# Patient Record
Sex: Female | Born: 1970 | Race: Black or African American | Hispanic: No | Marital: Married | Smoking: Never smoker
Health system: Southern US, Community
[De-identification: ages and names within clinical notes are randomized; demographics above are authoritative.]

## PROBLEM LIST (undated history)

## (undated) DIAGNOSIS — D649 Anemia, unspecified: Secondary | ICD-10-CM

## (undated) DIAGNOSIS — D259 Leiomyoma of uterus, unspecified: Secondary | ICD-10-CM

## (undated) DIAGNOSIS — I10 Essential (primary) hypertension: Secondary | ICD-10-CM

## (undated) HISTORY — PX: UTERINE FIBROID SURGERY: SHX826

## (undated) HISTORY — DX: Leiomyoma of uterus, unspecified: D25.9

## (undated) HISTORY — DX: Anemia, unspecified: D64.9

## (undated) HISTORY — PX: KNEE SURGERY: SHX244

## (undated) HISTORY — DX: Essential (primary) hypertension: I10

---

## 2009-01-07 ENCOUNTER — Encounter: Payer: Self-pay | Admitting: Cardiology

## 2009-07-31 ENCOUNTER — Encounter: Payer: Self-pay | Admitting: Cardiology

## 2009-08-02 ENCOUNTER — Ambulatory Visit: Payer: Self-pay | Admitting: Cardiology

## 2009-08-02 DIAGNOSIS — R002 Palpitations: Secondary | ICD-10-CM

## 2009-08-02 DIAGNOSIS — R079 Chest pain, unspecified: Secondary | ICD-10-CM

## 2009-08-02 DIAGNOSIS — I10 Essential (primary) hypertension: Secondary | ICD-10-CM | POA: Insufficient documentation

## 2009-08-02 DIAGNOSIS — E669 Obesity, unspecified: Secondary | ICD-10-CM

## 2009-08-23 ENCOUNTER — Encounter: Admission: RE | Admit: 2009-08-23 | Discharge: 2009-08-23 | Payer: Self-pay | Admitting: Family Medicine

## 2009-08-27 ENCOUNTER — Ambulatory Visit (HOSPITAL_COMMUNITY): Admission: RE | Admit: 2009-08-27 | Discharge: 2009-08-27 | Payer: Self-pay | Admitting: Cardiology

## 2009-08-27 ENCOUNTER — Ambulatory Visit: Payer: Self-pay

## 2009-08-27 ENCOUNTER — Encounter: Payer: Self-pay | Admitting: Cardiology

## 2009-08-27 ENCOUNTER — Ambulatory Visit: Payer: Self-pay | Admitting: Cardiology

## 2010-07-01 NOTE — Assessment & Plan Note (Signed)
Summary: NP6/ OBSEITY, HTN , CHESTPRESSURE. PT HAS [facility]/ GD   CC:  referal from Dr Patsy Lager.  History of Present Illness: 40 year old female for evaluation of chest pain. No prior cardiac history. The patient states that she has had a pain in the left upper chest when she exerts herself relieved with rest for  years. This goes back to high school days. However she does not have dyspnea on exertion, orthopnea, PND, pedal edema, palpitations, syncope or exertional chest pain. She also recently had a weeklong episode of tingling and numbness in her left upper extremity. There is also a pain in her left chest. This was continuous without ever completely resolving. There is no change with using her left upper extremity. It was nonexertional, pleuritic or related to food. It did not radiate. There were no associated symptoms. Because of the above we are asked to further evaluate.  Preventive Screening-Counseling & Management  Alcohol-Tobacco     Smoking Status: never      Drug Use:  no.    Current Medications (verified): 1)  Multivitamins   Tabs (Multiple Vitamin) .Marland Kitchen.. 1 Taqb By Mouth Once Daily When Pt Rembers  Past History:  Past Medical History: HYPERTENSION (ICD-401.9)  Past Surgical History: Arthroscopic surgery on right knee  Family History: Reviewed history and no changes required. No premature CAD Hypertension  Social History: Reviewed history and no changes required. Full Time Married  Tobacco Use - No.  Alcohol Use - yes Drug Use - no Smoking Status:  never Drug Use:  no  Review of Systems       no fevers or chills, productive cough, hemoptysis, dysphasia, odynophagia, melena, hematochezia, dysuria, hematuria, rash, seizure activity, orthopnea, PND, pedal edema, claudication. Remaining systems are negative. Menstrual cycles are normal.   Vital Signs:  Patient profile:   40 year old female Height:      64 inches Weight:      179 pounds BMI:     30.84 Pulse rate:    86 / minute Resp:     12 per minute BP sitting:   136 / 94  (left arm)  Vitals Entered By: Kem Parkinson (August 02, 2009 11:40 AM)  Physical Exam  General:  Blood pressure difficult to auscultate in the left upper extremity but systolic appears to be 110-114. Right upper extremity systolic 140. Well developed/well nourished in NAD Skin warm/dry Patient not depressed No peripheral clubbing Back-normal HEENT-normal/normal eyelids Neck supple/normal carotid upstroke bilaterally; no bruits; no JVD; no thyromegaly chest - CTA/ normal expansion CV - RRR/normal S1 and S2; no murmurs, rubs or gallops;  PMI nondisplaced Abdomen -NT/ND, no HSM, no mass, + bowel sounds, no bruit 2+ femoral pulses, no bruits Ext-no edema, chords, 2+ DP Neuro-grossly nonfocal     EKG  Procedure date:  08/02/2009  Findings:      Sinus rhythm at a rate of 86. Axis normal. No ST changes noted. RV conduction delay.  Impression & Recommendations:  Problem # 1:  CHEST PAIN (ICD-786.50) Symptoms very atypical. However she is concerned about these. We'll schedule stress echocardiogram for risk stratification. Orders: Stress Echo (Stress Echo)  Problem # 2:  HYPERTENSION (ICD-401.9) Blood pressure difficult to auscultate in left upper extremity but appears to be significantly different compared to right upper extremity. I will check arterial Dopplers to exclude subclavian stenosis. If negative then she can followup with her primary care physician. If her blood pressure continues to be elevated and certain medications can be added as  needed. Orders: Arterial Duplex Upper Extremity (Arterial Duplex Up )  Patient Instructions: 1)  Your physician recommends that you schedule a follow-up appointment in: ANEEDED PENDING TEST RESULTS 2)  Your physician has requested that you have a upper extremity arterial duplex.  This test is an ultrasound of the arteries in the legs or arms.  It looks at arterial blood flow  in the legs and arms.  Allow one hour for Lower and Upper Arterial scans. There are no restrictions or special instructions. 3)  Your physician has requested that you have a stress echocardiogram. For further information please visit [email or web address]  Please follow instruction sheet as given.

## 2011-01-06 ENCOUNTER — Other Ambulatory Visit: Payer: Self-pay | Admitting: Family Medicine

## 2011-01-06 DIAGNOSIS — Z1231 Encounter for screening mammogram for malignant neoplasm of breast: Secondary | ICD-10-CM

## 2011-01-14 ENCOUNTER — Ambulatory Visit: Payer: Self-pay

## 2011-01-14 ENCOUNTER — Ambulatory Visit
Admission: RE | Admit: 2011-01-14 | Discharge: 2011-01-14 | Disposition: A | Discharge status: Home / Self Care | Payer: 59 | Source: Ambulatory Visit | Attending: Family Medicine | Admitting: Family Medicine

## 2011-01-14 DIAGNOSIS — Z1231 Encounter for screening mammogram for malignant neoplasm of breast: Secondary | ICD-10-CM

## 2011-01-16 ENCOUNTER — Other Ambulatory Visit: Payer: Self-pay | Admitting: Family Medicine

## 2011-01-16 ENCOUNTER — Other Ambulatory Visit: Payer: Self-pay | Admitting: Internal Medicine

## 2011-01-16 DIAGNOSIS — R102 Pelvic and perineal pain: Secondary | ICD-10-CM

## 2011-01-16 DIAGNOSIS — R928 Other abnormal and inconclusive findings on diagnostic imaging of breast: Secondary | ICD-10-CM

## 2011-01-19 ENCOUNTER — Ambulatory Visit
Admission: RE | Admit: 2011-01-19 | Discharge: 2011-01-19 | Disposition: A | Discharge status: Home / Self Care | Payer: 59 | Source: Ambulatory Visit | Attending: Internal Medicine | Admitting: Internal Medicine

## 2011-01-19 DIAGNOSIS — R102 Pelvic and perineal pain: Secondary | ICD-10-CM

## 2011-01-23 ENCOUNTER — Ambulatory Visit
Admission: RE | Admit: 2011-01-23 | Discharge: 2011-01-23 | Disposition: A | Discharge status: Home / Self Care | Payer: 59 | Source: Ambulatory Visit | Attending: Family Medicine | Admitting: Family Medicine

## 2011-01-23 DIAGNOSIS — R928 Other abnormal and inconclusive findings on diagnostic imaging of breast: Secondary | ICD-10-CM

## 2011-08-11 ENCOUNTER — Other Ambulatory Visit: Payer: Self-pay | Admitting: Internal Medicine

## 2011-08-11 DIAGNOSIS — D249 Benign neoplasm of unspecified breast: Secondary | ICD-10-CM

## 2011-08-19 ENCOUNTER — Ambulatory Visit
Admission: RE | Admit: 2011-08-19 | Discharge: 2011-08-19 | Disposition: A | Discharge status: Home / Self Care | Payer: Self-pay | Source: Ambulatory Visit | Attending: Internal Medicine | Admitting: Internal Medicine

## 2011-08-19 ENCOUNTER — Ambulatory Visit
Admission: RE | Admit: 2011-08-19 | Discharge: 2011-08-19 | Disposition: A | Discharge status: Home / Self Care | Payer: 59 | Source: Ambulatory Visit | Attending: Internal Medicine | Admitting: Internal Medicine

## 2011-08-19 DIAGNOSIS — D249 Benign neoplasm of unspecified breast: Secondary | ICD-10-CM

## 2012-01-06 ENCOUNTER — Other Ambulatory Visit: Payer: Self-pay | Admitting: Internal Medicine

## 2012-01-06 DIAGNOSIS — D249 Benign neoplasm of unspecified breast: Secondary | ICD-10-CM

## 2012-01-25 ENCOUNTER — Ambulatory Visit
Admission: RE | Admit: 2012-01-25 | Discharge: 2012-01-25 | Disposition: A | Discharge status: Home / Self Care | Payer: 59 | Source: Ambulatory Visit | Attending: Internal Medicine | Admitting: Internal Medicine

## 2012-01-25 DIAGNOSIS — D249 Benign neoplasm of unspecified breast: Secondary | ICD-10-CM

## 2013-04-06 ENCOUNTER — Ambulatory Visit (INDEPENDENT_AMBULATORY_CARE_PROVIDER_SITE_OTHER): Payer: Self-pay

## 2013-04-06 ENCOUNTER — Encounter (INDEPENDENT_AMBULATORY_CARE_PROVIDER_SITE_OTHER): Payer: Self-pay

## 2013-04-06 ENCOUNTER — Encounter: Payer: Self-pay | Admitting: Neurology

## 2013-04-06 ENCOUNTER — Ambulatory Visit (INDEPENDENT_AMBULATORY_CARE_PROVIDER_SITE_OTHER): Payer: Self-pay | Admitting: Neurology

## 2013-04-06 ENCOUNTER — Other Ambulatory Visit: Payer: Self-pay | Admitting: Neurology

## 2013-04-06 VITALS — BP 152/103 | HR 92 | Ht 64.0 in | Wt 174.0 lb

## 2013-04-06 DIAGNOSIS — H538 Other visual disturbances: Secondary | ICD-10-CM

## 2013-04-06 DIAGNOSIS — H4710 Unspecified papilledema: Secondary | ICD-10-CM

## 2013-04-06 MED ORDER — GADOPENTETATE DIMEGLUMINE 469.01 MG/ML IV SOLN: 16.0000 mL | Freq: Once | INTRAVENOUS | Status: AC | PRN

## 2013-04-06 NOTE — Patient Instructions (Signed)
Overall you are doing fairly well but I do want to suggest a few things today:   [facility] and get blood work done. Bring the results back to our office. If they are within normal limits we will be able to go ahead with a MRI.   We will follow up with you once the MRI is completed  Please also call us for any test results so we can go over those with you on the phone.  My clinical assistant and will answer any of your questions and relay your messages to me and also relay most of my messages to you.   Our phone number is [phone]. We also have an after hours call service for urgent matters and there is a physician on-call for urgent questions. For any emergencies you know to call 911 or go to the nearest emergency room

## 2013-04-06 NOTE — Progress Notes (Signed)
[facility]    Provider:  Dr Hosie Poisson Referring Provider: No ref. provider found Primary Care Physician:  Tally Due, MD  CC:  Bilateral papilledema   HPI:  Destiny Keller is a 42 y.o. female here as a referral for bilateral papilledema.   Was seeing her eye doctor due to blurry vision for the past few months. Blurry vision worse upon waking and in the morning, wakes up and it is difficult to focus, gets better as day goes on. Describes as a blurry/foggy feeling. No transient obscurations. No blinking lights/scotomas. No noted difficulty with peripheral vision. Notes some pressure around her eyes but no pain. No headaches noted. No focal numbness or weakness. No dizziness, no vertigo. No recent weight gain or loss.  Review of Systems: Out of a complete 14 system review, the patient complains of only the following symptoms, and all other reviewed systems are negative + for blurred vision and anemia  History   Social History  . Marital Status: Married    Spouse Name: michael    Number of Children: 0  . Years of Education: college   Occupational History  . self employed    Social History Main Topics  . Smoking status: Never Smoker   . Smokeless tobacco: Not on file  . Alcohol Use: No  . Drug Use: No  . Sexual Activity: Yes   Other Topics Concern  . Not on file   Social History Narrative  . No narrative on file    No family history on file.  Past Medical History  Diagnosis Date  . Hypertension   . Fibroid uterus   . Anemia     Past Surgical History  Procedure Laterality Date  . Uterine fibroid surgery    . Knee surgery      Current Outpatient Prescriptions  Medication Sig Dispense Refill  . ergocalciferol (VITAMIN D2) 50000 UNITS capsule Take 50,000 Units by mouth once a week.      . hydrochlorothiazide (MICROZIDE) 12.5 MG capsule Take 12.5 mg by mouth daily.       No current facility-administered medications for this visit.     Allergies as of 04/06/2013 - Review Complete 04/06/2013  Allergen Reaction Noted  . Penicillins  04/06/2013    Vitals: BP 152/103  Pulse 92  Ht 5\' 4"  (1.626 m)  Wt 174 lb (78.926 kg)  BMI 29.85 kg/m2  LMP 03/14/2013 Last Weight:  Wt Readings from Last 1 Encounters:  04/06/13 174 lb (78.926 kg)   Last Height:   Ht Readings from Last 1 Encounters:  04/06/13 5\' 4"  (1.626 m)     Physical exam: Exam: Gen: NAD, conversant Eyes: anicteric sclerae, moist conjunctivae HENT: Atraumatic, oropharynx clear Neck: Trachea midline; supple,  Lungs: CTA, no wheezing, rales, rhonic                          CV: RRR, no MRG Abdomen: Soft, non-tender;  Extremities: No peripheral edema  Skin: Normal temperature, no rash,  Psych: Appropriate affect, pleasant  Neuro: MS: AA&Ox3, appropriately interactive, normal affect   Speech: fluent w/o paraphasic error  Memory: good recent and remote recall  CN: PERRL, visual fields full to finger count bilaterally, papilledema  noted in OD, unable to fully visualize OS EOMI no nystagmus, no ptosis, sensation intact to LT V1-V3 bilat, face symmetric, no weakness, hearing grossly intact, palate elevates symmetrically, shoulder shrug 5/5 bilat,  tongue protrudes midline, no fasiculations noted.  Motor: normal bulk and tone Strength: 5/5  In all extremities  Coord: rapid alternating and point-to-point (FNF, HTS) movements intact.  Reflexes: symmetrical, bilat downgoing toes  Sens: LT intact in all extremities  Gait: posture, stance, stride and arm-swing normal. Tandem gait intact. Able to walk on heels and toes. Romberg absent.   Assessment:  After physical and neurologic examination, review of laboratory studies, imaging, neurophysiology testing and pre-existing records, assessment will be reviewed on the problem list.  Plan:  Treatment plan and additional workup will be reviewed under Problem List.  1)Blurry  vision 2)Papilledema  42 year old woman sent over from eye clinic for noted bilateral papilledema and blurry vision. Patient notes symptoms are worse in the morning upon waking and improve as the day goes on. Symptoms are concerning for possible increased intracranial pressure. Differential would include structural lesion versus IIH. Will check brain MRI with and without contrast. Pending results would consider starting patient on Diamox and or lumbar puncture.

## 2013-04-07 ENCOUNTER — Telehealth: Payer: Self-pay | Admitting: Neurology

## 2013-04-07 MED ORDER — ACETAZOLAMIDE 125 MG PO TABS: ORAL_TABLET | ORAL | Status: DC

## 2013-04-07 MED ORDER — GADOPENTETATE DIMEGLUMINE 469.01 MG/ML IV SOLN: 16.0000 mL | Freq: Once | INTRAVENOUS | Status: AC | PRN

## 2013-04-07 NOTE — Telephone Encounter (Signed)
Called patient to discuss MRI results. 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 At this time due to the fact that she is having visual changes we will start her on Diamox 125 mg 3 times a day. Will plan to have her followup in 3 months, she was instructed to call the office to schedule appointment her convenience. She is counseled potential risks of Diamox. She was counseled extensively that if her vision worsens or she develops of her headaches to medially call our office. We will consider a lumbar puncture in the future pending results of Diamox.

## 2013-04-26 ENCOUNTER — Encounter: Payer: Self-pay | Admitting: Neurology

## 2013-09-04 ENCOUNTER — Telehealth: Payer: Self-pay | Admitting: Neurology

## 2013-09-04 ENCOUNTER — Other Ambulatory Visit: Payer: Self-pay | Admitting: Obstetrics and Gynecology

## 2013-09-04 DIAGNOSIS — D249 Benign neoplasm of unspecified breast: Secondary | ICD-10-CM

## 2013-09-04 NOTE — Telephone Encounter (Signed)
Pt called needs to get in for her 3 mth f/u per Dr. Hazle Quant notes. 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 Pt would like for someone to call her to see if there is a work in apt through [employer]. Pt is in [place] and will be heading back to [place] after Wed. If there is no opening pt would like to see if Dr. Janann Colonel can do a referral in [place] for her. Thanks

## 2013-09-04 NOTE — Telephone Encounter (Signed)
Patient calling requesting appointment, no open slots available, patient now lives and works in [place] and states that if you can not see her either on Tuesday or Wednesday, she request a referral for a neurologist in [place], [place]. Please advise.

## 2013-09-04 NOTE — Telephone Encounter (Signed)
Please schedule her for 3pm on Wednesday. Thanks. (the slot will need to be unblocked)

## 2013-09-06 ENCOUNTER — Ambulatory Visit (INDEPENDENT_AMBULATORY_CARE_PROVIDER_SITE_OTHER): Payer: BC Managed Care – PPO | Admitting: Neurology

## 2013-09-06 ENCOUNTER — Encounter: Payer: Self-pay | Admitting: Neurology

## 2013-09-06 VITALS — BP 143/98 | HR 89 | Ht 64.0 in | Wt 164.0 lb

## 2013-09-06 DIAGNOSIS — R51 Headache: Secondary | ICD-10-CM

## 2013-09-06 NOTE — Progress Notes (Signed)
[facility]    Provider:  Dr Janann Colonel Referring Provider: Orma Flaming, MD Primary Care Physician:  Kennon Portela, MD  CC:  Bilateral papilledema   HPI:  Destiny Keller is a 43 y.o. 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 Last visit was 04/06/13 at which time she had a MRI pertinent for empty sella and was started on diamox. Notes minimal to no headaches, if she misses a dose then she will have a slight headache. Currently taking diamox 3x a day, tolerating it well. Continues to have difficulty with vision, no noted transient obscurations. Has not seen eye doctor in around 6 months.   Initial visit: Was seeing her eye doctor due to blurry vision for the past few months. Blurry vision worse upon waking and in the morning, wakes up and it is difficult to focus, gets better as day goes on. Describes as a blurry/foggy feeling. No transient obscurations. No blinking lights/scotomas. No noted difficulty with peripheral vision. Notes some pressure around her eyes but no pain. No headaches noted. No focal numbness or weakness. No dizziness, no vertigo. No recent weight gain or loss.  Review of Systems: Out of a complete 14 system review, the patient complains of only the following symptoms, and all other reviewed systems are negative + for blurred vision and anemia  History   Social History  . Marital Status: Married    Spouse Name: michael    Number of Children: 0  . Years of Education: college   Occupational History  . self employed    Social History Main Topics  . Smoking status: Never Smoker   . Smokeless tobacco: Never Used  . Alcohol Use: No  . Drug Use: No  . Sexual Activity: Yes   Other Topics Concern  . Not on file   Social History Narrative   Married, no children   Right handed   Bachelor degree   None    No family history on file.  Past Medical History  Diagnosis Date  . Hypertension   . Fibroid  uterus   . Anemia     Past Surgical History  Procedure Laterality Date  . Uterine fibroid surgery    . Knee surgery      Current Outpatient Prescriptions  Medication Sig Dispense Refill  . acetaZOLAMIDE (DIAMOX) 125 MG tablet 1 qd for 3 days, then increase to 1 tab bid for 3 days then 1 tab tid  90 tablet  3  . ergocalciferol (VITAMIN D2) 50000 UNITS capsule Take 50,000 Units by mouth once a week.      . hydrochlorothiazide (MICROZIDE) 12.5 MG capsule Take 12.5 mg by mouth daily.       No current facility-administered medications for this visit.    Allergies as of 09/06/2013 - Review Complete 09/06/2013  Allergen Reaction Noted  . Penicillins  04/06/2013    Vitals: BP 143/98  Pulse 89  Ht 5\' 4"  (1.626 m)  Wt 164 lb (74.39 kg)  BMI 28.14 kg/m2 Last Weight:  Wt Readings from Last 1 Encounters:  09/06/13 164 lb (74.39 kg)   Last Height:   Ht Readings from Last 1 Encounters:  09/06/13 5\' 4"  (1.626 m)     Physical exam: Exam: Gen: NAD, conversant Eyes: anicteric sclerae, moist conjunctivae HENT: Atraumatic, oropharynx clear Neck: Trachea midline; supple,  Lungs: CTA, no wheezing, rales, rhonic  CV: RRR, no MRG Abdomen: Soft, non-tender;  Extremities: No peripheral edema  Skin: Normal temperature, no rash,  Psych: Appropriate affect, pleasant  Neuro: MS: AA&Ox3, appropriately interactive, normal affect   Speech: fluent w/o paraphasic error  Memory: good recent and remote recall  CN: PERRL, visual fields full to finger count bilaterally, unable to visualize optic disc bilaterally due to pupil size, EOMI no nystagmus, no ptosis, sensation intact to LT V1-V3 bilat, face symmetric, no weakness, hearing grossly intact, palate elevates symmetrically, shoulder shrug 5/5 bilat,  tongue protrudes midline, no fasiculations noted.  Motor: normal bulk and tone Strength: 5/5  In all extremities  Coord: rapid alternating and point-to-point (FNF,  HTS) movements intact.  Reflexes: symmetrical, bilat downgoing toes  Sens: LT intact in all extremities  Gait: posture, stance, stride and arm-swing normal. Tandem gait intact. Able to walk on heels and toes. Romberg absent.   Assessment:  After physical and neurologic examination, review of laboratory studies, imaging, neurophysiology testing and pre-existing records, assessment will be reviewed on the problem list.  Plan:  Treatment plan and additional workup will be reviewed under Problem List.  1)Blurry vision 2)Papilledema  43 year old woman initially sent over from eye clinic for noted bilateral papilledema and blurry vision. Patient notes symptoms are worse in the morning upon waking and improve as the day goes on. 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 Had unremarkable brain MRI. Will continue on diamox for now and check lumbar puncture. Follow up once lumbar puncture completed.

## 2013-11-14 ENCOUNTER — Other Ambulatory Visit: Payer: Self-pay | Admitting: Obstetrics and Gynecology

## 2013-11-14 ENCOUNTER — Ambulatory Visit
Admission: RE | Admit: 2013-11-14 | Discharge: 2013-11-14 | Disposition: A | Discharge status: Home / Self Care | Payer: BC Managed Care – PPO | Source: Ambulatory Visit | Attending: Obstetrics and Gynecology | Admitting: Obstetrics and Gynecology

## 2013-11-14 DIAGNOSIS — D249 Benign neoplasm of unspecified breast: Secondary | ICD-10-CM

## 2013-11-15 ENCOUNTER — Other Ambulatory Visit: Payer: Self-pay | Admitting: Neurology

## 2013-11-15 ENCOUNTER — Ambulatory Visit
Admission: RE | Admit: 2013-11-15 | Discharge: 2013-11-15 | Disposition: A | Discharge status: Home / Self Care | Payer: BC Managed Care – PPO | Source: Ambulatory Visit | Attending: Neurology | Admitting: Neurology

## 2013-11-15 VITALS — BP 139/90 | HR 67

## 2013-11-15 DIAGNOSIS — H539 Unspecified visual disturbance: Secondary | ICD-10-CM

## 2013-11-15 DIAGNOSIS — R51 Headache: Secondary | ICD-10-CM

## 2013-11-15 LAB — CSF CELL COUNT WITH DIFFERENTIAL
RBC Count, CSF: 0 cu mm
TUBE #: 1
WBC, CSF: 0 cu mm (ref 0–5)

## 2013-11-15 LAB — PROTEIN, CSF: Total Protein, CSF: 25 mg/dL (ref 15–45)

## 2013-11-15 MED ORDER — ACETAZOLAMIDE 250 MG PO TABS: 250.0000 mg | ORAL_TABLET | Freq: Three times a day (TID) | ORAL | Status: AC

## 2013-11-15 NOTE — Discharge Instructions (Signed)

## 2013-11-16 NOTE — Progress Notes (Signed)
Quick Note:  Called patient left voice message explaining LP results, patient's diagnosis is consistent with her current diagnosis, new prescription has been sent to the pharmacy, instructed patient to call back to schedule 4 month f/u, also advised her to call back with any worsening headaches or vision. ______

## 2013-11-17 ENCOUNTER — Other Ambulatory Visit: Payer: Self-pay | Admitting: Neurology

## 2013-11-17 ENCOUNTER — Telehealth: Payer: Self-pay | Admitting: Neurology

## 2013-11-17 MED ORDER — TRAMADOL HCL 50 MG PO TABS: 50.0000 mg | ORAL_TABLET | Freq: Two times a day (BID) | ORAL | Status: AC | PRN

## 2013-11-17 NOTE — Telephone Encounter (Signed)
Please let her know that this unfortunately can be a common side effect of lumbar punctures and should improve over the next few days. She should stay flat on her back as much as she can, hydrate well and try drinking some soda with a lot of caffeine. I will send in a prescription for tramadol 50mg  to be used twice a day as needed if the pain gets severe enough. Please call on Monday if headache has not resolved.

## 2013-11-17 NOTE — Telephone Encounter (Signed)
Spoke with patient and she started having the headaches right after the Lumbar Puncture,only taking prescribed medications by Dr Janann Colonel

## 2013-11-17 NOTE — Telephone Encounter (Signed)
Pt called states she had the Lumbar puncture done and that the Tech advised her if she started getting headaches to call her Dr. Abbott Keller states she is having headaches and she hadn't had them before. Please call pt back concerning this matter. Thanks.

## 2013-11-17 NOTE — Telephone Encounter (Signed)
Shared message with patient , she verbalized understanding

## 2013-11-18 LAB — CSF CULTURE
GRAM STAIN: NONE SEEN
GRAM STAIN: NONE SEEN

## 2013-11-18 LAB — CSF CULTURE W GRAM STAIN: Organism ID, Bacteria: NO GROWTH

## 2013-11-30 LAB — BUN+CREAT
BUN/Creatinine Ratio: 14 (ref 9–23)
BUN: 11 mg/dL (ref 6–24)
Creatinine, Ser: 0.78 mg/dL (ref 0.57–1.00)
GFR calc Af Amer: 108 mL/min/{1.73_m2} (ref >59)
GFR calc non Af Amer: 94 mL/min/{1.73_m2} (ref >59)

## 2015-01-15 ENCOUNTER — Other Ambulatory Visit: Payer: Self-pay | Admitting: Obstetrics and Gynecology

## 2015-01-15 DIAGNOSIS — R928 Other abnormal and inconclusive findings on diagnostic imaging of breast: Secondary | ICD-10-CM

## 2015-01-25 ENCOUNTER — Ambulatory Visit
Admission: RE | Admit: 2015-01-25 | Discharge: 2015-01-25 | Disposition: A | Discharge status: Home / Self Care | Payer: BC Managed Care – PPO | Source: Ambulatory Visit | Attending: Obstetrics and Gynecology | Admitting: Obstetrics and Gynecology

## 2015-01-25 DIAGNOSIS — R928 Other abnormal and inconclusive findings on diagnostic imaging of breast: Secondary | ICD-10-CM

## 2015-01-31 ENCOUNTER — Other Ambulatory Visit: Payer: Self-pay | Admitting: Obstetrics and Gynecology

## 2015-01-31 DIAGNOSIS — R928 Other abnormal and inconclusive findings on diagnostic imaging of breast: Secondary | ICD-10-CM

## 2015-05-14 ENCOUNTER — Ambulatory Visit (HOSPITAL_COMMUNITY)
Admission: RE | Admit: 2015-05-14 | Payer: BC Managed Care – PPO | Source: Ambulatory Visit | Admitting: Obstetrics and Gynecology

## 2015-05-14 ENCOUNTER — Encounter (HOSPITAL_COMMUNITY): Admission: RE | Payer: Self-pay | Source: Ambulatory Visit

## 2015-05-14 SURGERY — HYSTERECTOMY, VAGINAL, LAPAROSCOPY-ASSISTED
Anesthesia: General

## 2015-12-16 ENCOUNTER — Ambulatory Visit
Admission: RE | Admit: 2015-12-16 | Discharge: 2015-12-16 | Disposition: A | Discharge status: Home / Self Care | Payer: BC Managed Care – PPO | Source: Ambulatory Visit | Attending: Obstetrics and Gynecology | Admitting: Obstetrics and Gynecology

## 2015-12-16 DIAGNOSIS — R928 Other abnormal and inconclusive findings on diagnostic imaging of breast: Secondary | ICD-10-CM

## 2016-10-30 ENCOUNTER — Other Ambulatory Visit: Payer: Self-pay | Admitting: Obstetrics and Gynecology

## 2016-10-30 DIAGNOSIS — Z1231 Encounter for screening mammogram for malignant neoplasm of breast: Secondary | ICD-10-CM

## 2016-11-12 ENCOUNTER — Ambulatory Visit
Admission: RE | Admit: 2016-11-12 | Discharge: 2016-11-12 | Disposition: A | Discharge status: Home / Self Care | Payer: BC Managed Care – PPO | Source: Ambulatory Visit | Attending: Obstetrics and Gynecology | Admitting: Obstetrics and Gynecology

## 2016-11-12 DIAGNOSIS — Z1231 Encounter for screening mammogram for malignant neoplasm of breast: Secondary | ICD-10-CM

## 2017-04-16 ENCOUNTER — Other Ambulatory Visit (HOSPITAL_COMMUNITY): Payer: Self-pay | Admitting: *Deleted

## 2017-04-19 ENCOUNTER — Ambulatory Visit (HOSPITAL_COMMUNITY)
Admission: RE | Admit: 2017-04-19 | Discharge: 2017-04-19 | Disposition: A | Discharge status: Home / Self Care | Payer: BC Managed Care – PPO | Source: Ambulatory Visit | Attending: Obstetrics and Gynecology | Admitting: Obstetrics and Gynecology

## 2017-04-19 DIAGNOSIS — D509 Iron deficiency anemia, unspecified: Secondary | ICD-10-CM | POA: Diagnosis not present

## 2017-04-19 DIAGNOSIS — D259 Leiomyoma of uterus, unspecified: Secondary | ICD-10-CM | POA: Diagnosis not present

## 2017-04-19 DIAGNOSIS — N920 Excessive and frequent menstruation with regular cycle: Secondary | ICD-10-CM | POA: Diagnosis not present

## 2017-04-19 MED ORDER — SODIUM CHLORIDE 0.9 % IV SOLN
510.0000 mg | INTRAVENOUS | Status: DC
  Administered 2017-04-19: 510 mg via INTRAVENOUS
  Filled 2017-04-19: qty 17

## 2017-04-19 NOTE — Discharge Instructions (Signed)

## 2017-04-23 ENCOUNTER — Ambulatory Visit (HOSPITAL_COMMUNITY)
Admission: RE | Admit: 2017-04-23 | Discharge: 2017-04-23 | Disposition: A | Discharge status: Home / Self Care | Payer: BC Managed Care – PPO | Source: Ambulatory Visit | Attending: Obstetrics and Gynecology | Admitting: Obstetrics and Gynecology

## 2017-04-23 DIAGNOSIS — D259 Leiomyoma of uterus, unspecified: Secondary | ICD-10-CM | POA: Insufficient documentation

## 2017-04-23 DIAGNOSIS — D509 Iron deficiency anemia, unspecified: Secondary | ICD-10-CM | POA: Insufficient documentation

## 2017-04-23 DIAGNOSIS — N920 Excessive and frequent menstruation with regular cycle: Secondary | ICD-10-CM | POA: Insufficient documentation

## 2017-04-23 MED ORDER — SODIUM CHLORIDE 0.9 % IV SOLN
510.0000 mg | INTRAVENOUS | Status: DC
  Administered 2017-04-23: 510 mg via INTRAVENOUS
  Filled 2017-04-23: qty 17

## 2018-03-01 IMAGING — MG DIGITAL SCREENING BILATERAL MAMMOGRAM WITH CAD
4 series · 4 of 4 positions shown · non-contrast
Comparison: Previous exam(s).

CLINICAL DATA: Screening.

EXAM:
DIGITAL SCREENING BILATERAL MAMMOGRAM WITH CAD

[L CC]
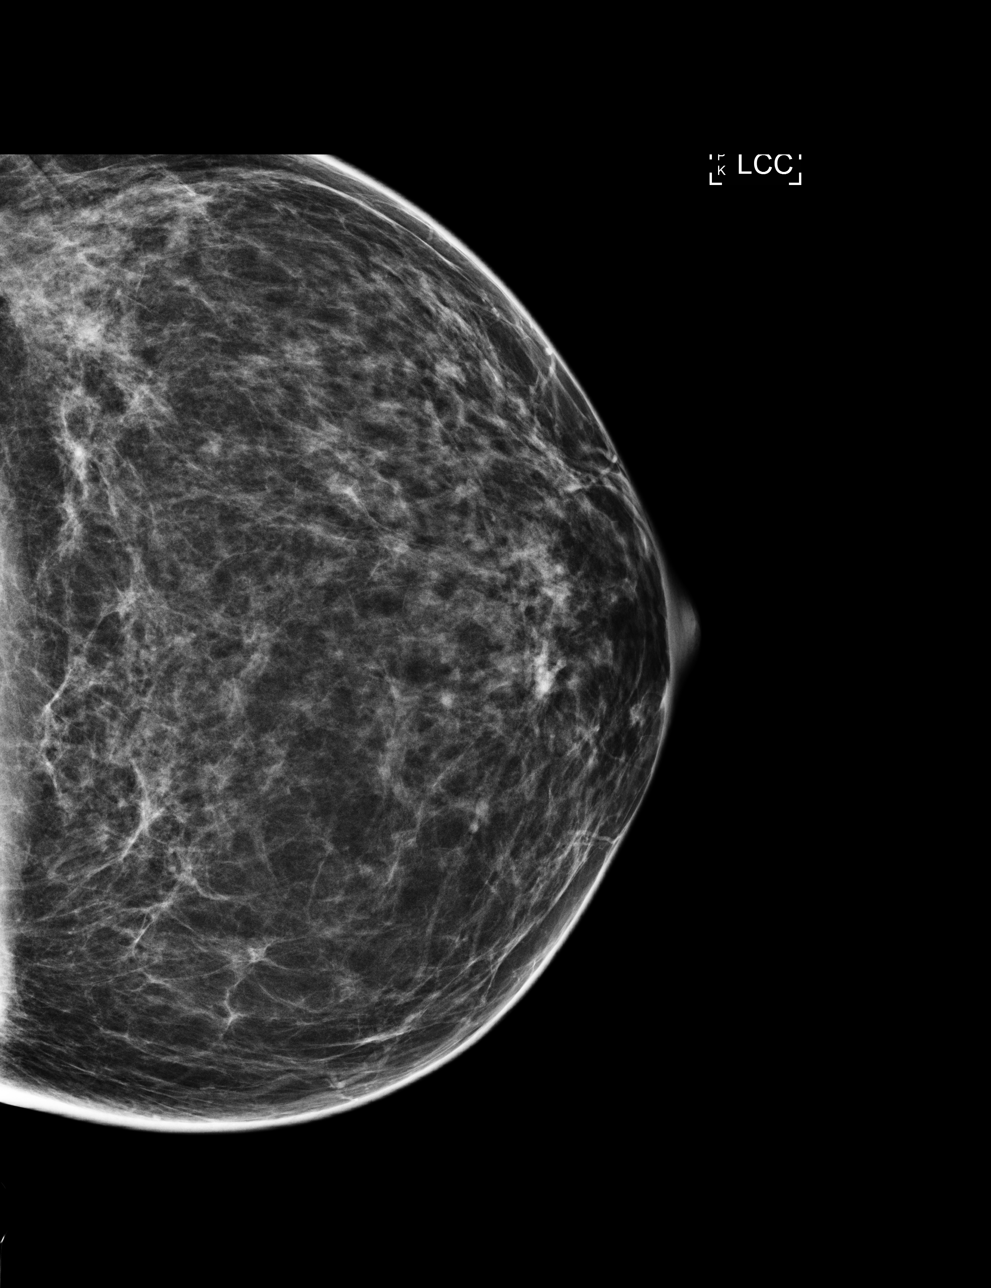

[L MLO]
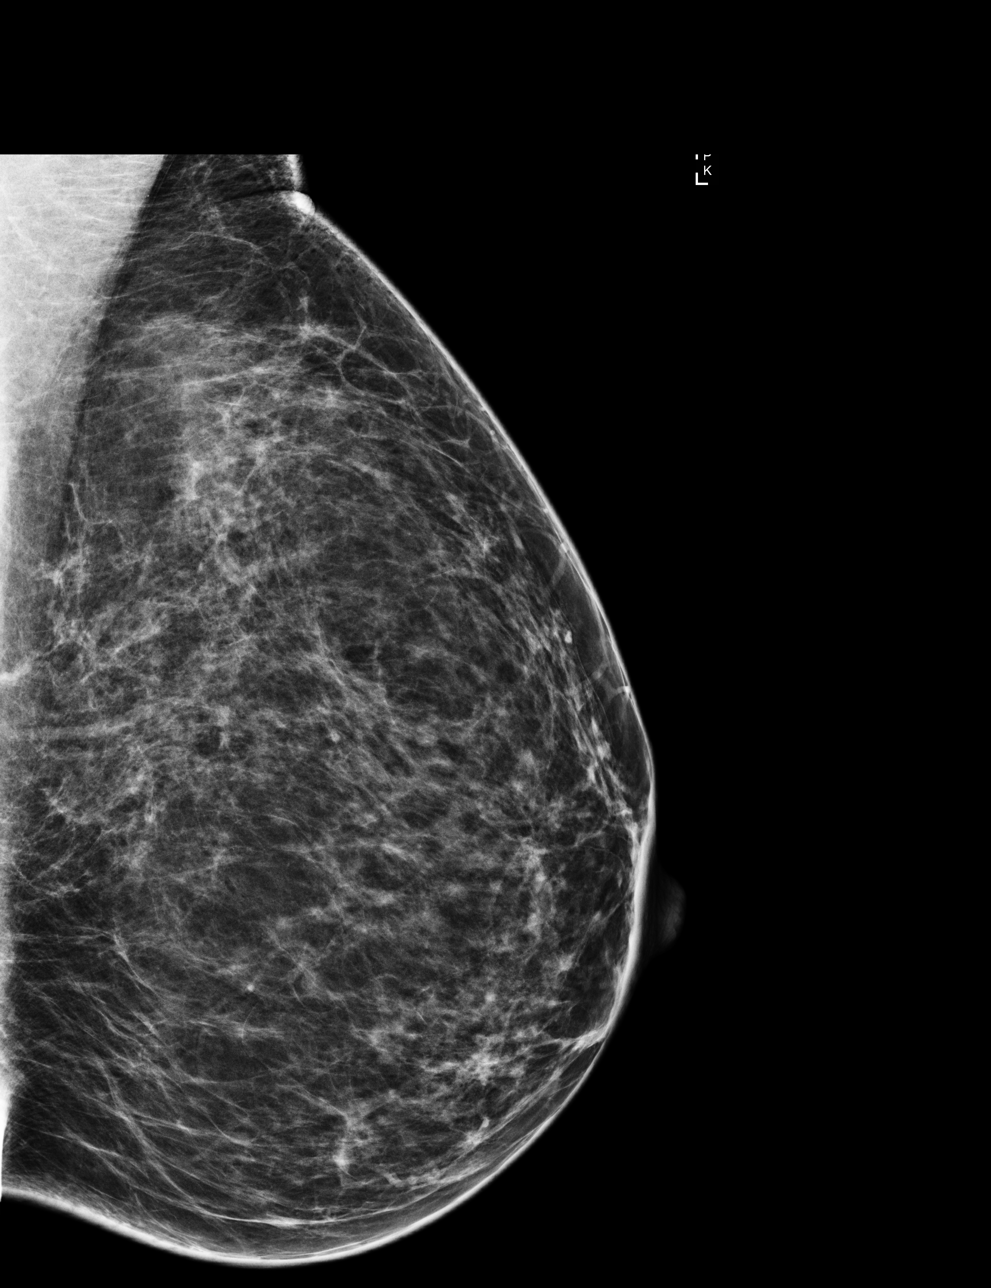

[R CC]
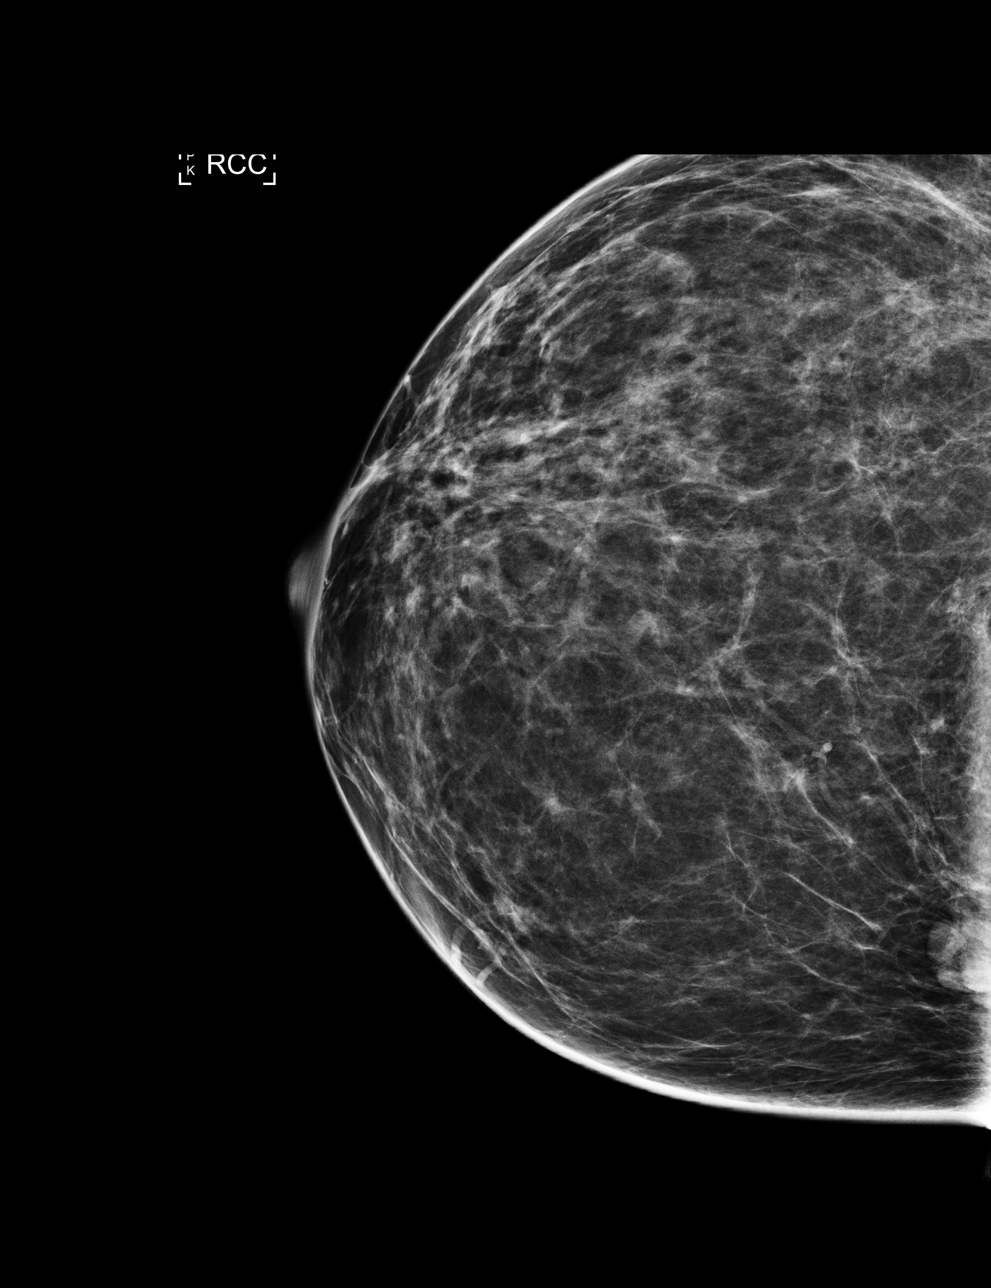

[R MLO]
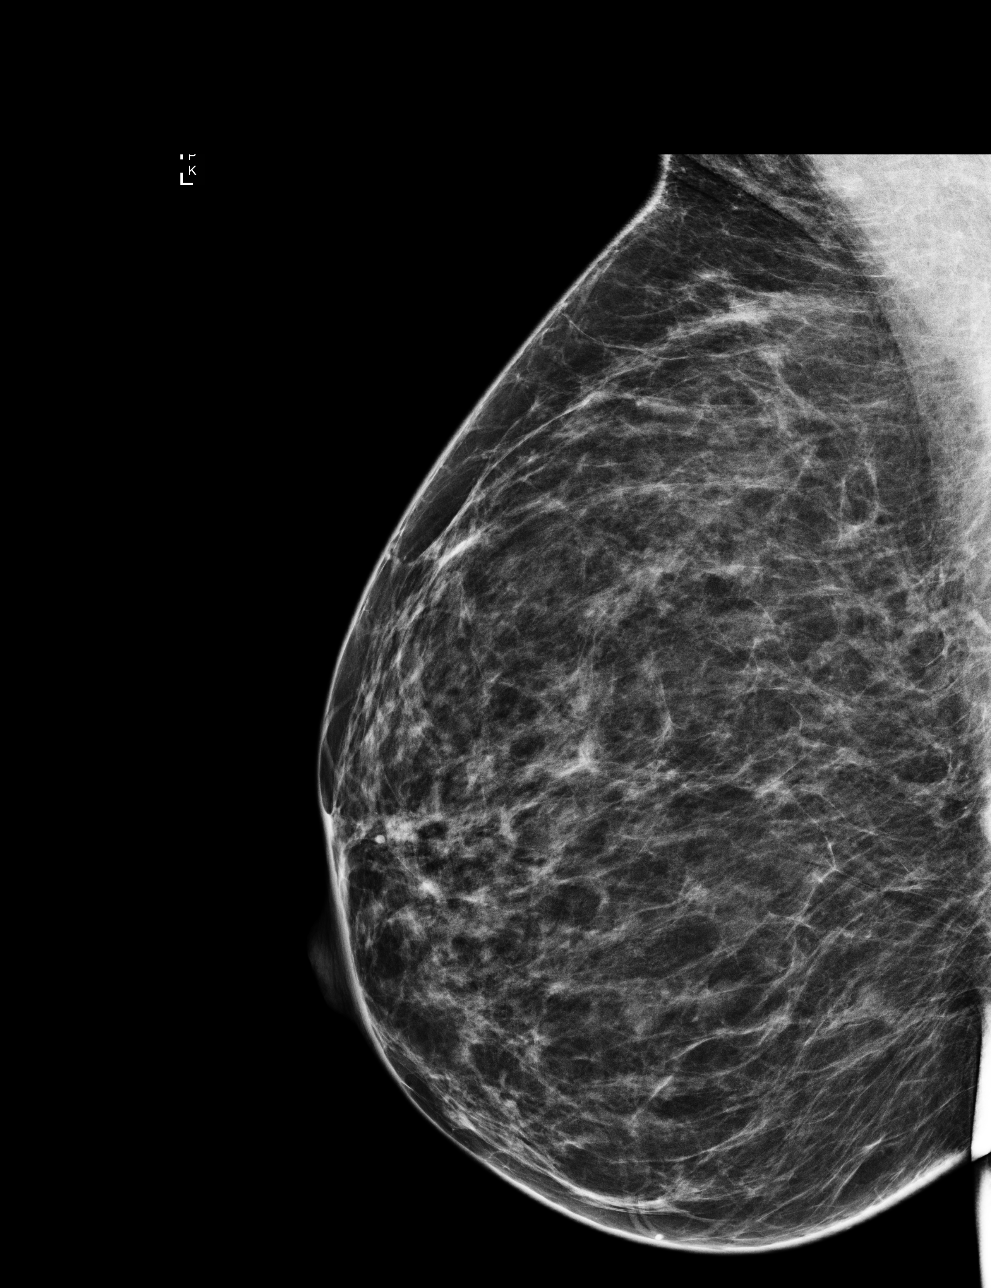

[4 of 4 positions shown; findings below may reference images not displayed]

ACR Breast Density Category b: There are scattered areas of
fibroglandular density.
FINDINGS: There are no findings suspicious for malignancy. Images were
processed with CAD.
IMPRESSION: No mammographic evidence of malignancy. A result letter of this
screening mammogram will be mailed directly to the patient.

RECOMMENDATION:
Screening mammogram in one year. (Code:AS-G-LCT)

BI-RADS CATEGORY  1: Negative.
